# Patient Record
Sex: Female | Born: 1960 | Race: White | Hispanic: No | Marital: Married | Smoking: Never smoker
Health system: Southern US, Community
[De-identification: ages and names within clinical notes are randomized; demographics above are authoritative.]

## PROBLEM LIST (undated history)

## (undated) DIAGNOSIS — R001 Bradycardia, unspecified: Secondary | ICD-10-CM

## (undated) DIAGNOSIS — T7840XA Allergy, unspecified, initial encounter: Secondary | ICD-10-CM

## (undated) HISTORY — DX: Bradycardia, unspecified: R00.1

## (undated) HISTORY — DX: Allergy, unspecified, initial encounter: T78.40XA

## (undated) HISTORY — PX: FOOT SURGERY: SHX648

---

## 1999-07-24 ENCOUNTER — Other Ambulatory Visit: Admission: RE | Admit: 1999-07-24 | Discharge: 1999-07-24 | Payer: Self-pay | Admitting: Obstetrics and Gynecology

## 2002-01-16 ENCOUNTER — Other Ambulatory Visit: Admission: RE | Admit: 2002-01-16 | Discharge: 2002-01-16 | Payer: Self-pay | Admitting: Internal Medicine

## 2004-03-03 ENCOUNTER — Other Ambulatory Visit: Admission: RE | Admit: 2004-03-03 | Discharge: 2004-03-03 | Payer: Self-pay | Admitting: Internal Medicine

## 2006-03-21 ENCOUNTER — Other Ambulatory Visit: Admission: RE | Admit: 2006-03-21 | Discharge: 2006-03-21 | Payer: Self-pay | Admitting: Internal Medicine

## 2009-07-26 ENCOUNTER — Other Ambulatory Visit: Admission: RE | Admit: 2009-07-26 | Discharge: 2009-07-26 | Payer: Self-pay | Admitting: Internal Medicine

## 2011-08-08 ENCOUNTER — Encounter: Payer: Self-pay | Admitting: Gastroenterology

## 2011-08-16 ENCOUNTER — Other Ambulatory Visit: Payer: Self-pay | Admitting: Internal Medicine

## 2011-08-16 DIAGNOSIS — Z1231 Encounter for screening mammogram for malignant neoplasm of breast: Secondary | ICD-10-CM

## 2011-08-27 ENCOUNTER — Ambulatory Visit
Admission: RE | Admit: 2011-08-27 | Discharge: 2011-08-27 | Disposition: A | Discharge status: Home / Self Care | Payer: 59 | Source: Ambulatory Visit | Attending: Internal Medicine | Admitting: Internal Medicine

## 2011-08-27 DIAGNOSIS — Z1231 Encounter for screening mammogram for malignant neoplasm of breast: Secondary | ICD-10-CM

## 2011-09-03 ENCOUNTER — Other Ambulatory Visit: Payer: Self-pay | Admitting: Internal Medicine

## 2011-09-03 DIAGNOSIS — R928 Other abnormal and inconclusive findings on diagnostic imaging of breast: Secondary | ICD-10-CM

## 2011-09-05 ENCOUNTER — Ambulatory Visit
Admission: RE | Admit: 2011-09-05 | Discharge: 2011-09-05 | Disposition: A | Discharge status: Home / Self Care | Payer: 59 | Source: Ambulatory Visit | Attending: Internal Medicine | Admitting: Internal Medicine

## 2011-09-05 DIAGNOSIS — R928 Other abnormal and inconclusive findings on diagnostic imaging of breast: Secondary | ICD-10-CM

## 2011-09-07 ENCOUNTER — Encounter: Payer: Self-pay | Admitting: Gastroenterology

## 2011-09-18 ENCOUNTER — Ambulatory Visit (AMBULATORY_SURGERY_CENTER): Payer: 59 | Admitting: *Deleted

## 2011-09-18 VITALS — Ht 69.5 in | Wt 159.0 lb

## 2011-09-18 DIAGNOSIS — Z1211 Encounter for screening for malignant neoplasm of colon: Secondary | ICD-10-CM

## 2011-09-18 MED ORDER — PEG-KCL-NACL-NASULF-NA ASC-C 100 G PO SOLR: ORAL | Status: DC

## 2011-09-19 ENCOUNTER — Encounter: Payer: Self-pay | Admitting: Internal Medicine

## 2011-10-02 ENCOUNTER — Encounter: Payer: Self-pay | Admitting: Gastroenterology

## 2011-10-03 ENCOUNTER — Ambulatory Visit (AMBULATORY_SURGERY_CENTER): Payer: 59 | Admitting: Internal Medicine

## 2011-10-03 ENCOUNTER — Encounter: Payer: Self-pay | Admitting: Internal Medicine

## 2011-10-03 VITALS — BP 107/72 | HR 57 | Temp 97.9°F | Resp 16 | Ht 69.0 in | Wt 159.0 lb

## 2011-10-03 DIAGNOSIS — Z1211 Encounter for screening for malignant neoplasm of colon: Secondary | ICD-10-CM

## 2011-10-03 MED ORDER — SODIUM CHLORIDE 0.9 % IV SOLN: 500.0000 mL | INTRAVENOUS | Status: AC

## 2011-10-03 NOTE — Patient Instructions (Signed)
YOU HAD AN ENDOSCOPIC PROCEDURE TODAY AT THE [facility]: Refer to the procedure report that was given to you for any specific questions about what was found during the examination.  If the procedure report does not answer your questions, please call your gastroenterologist to clarify.  If you requested that your care partner not be given the details of your procedure findings, then the procedure report has been included in a sealed envelope for you to review at your convenience later.  YOU SHOULD EXPECT: Some feelings of bloating in the abdomen. Passage of more gas than usual.  Walking can help get rid of the air that was put into your GI tract during the procedure and reduce the bloating. If you had a lower endoscopy (such as a colonoscopy or flexible sigmoidoscopy) you may notice spotting of blood in your stool or on the toilet paper. If you underwent a bowel prep for your procedure, then you may not have a normal bowel movement for a few days.  DIET: Your first meal following the procedure should be a light meal and then it is ok to progress to your normal diet.  A half-sandwich or bowl of soup is an example of a good first meal.  Heavy or fried foods are harder to digest and may make you feel nauseous or bloated.  Likewise meals heavy in dairy and vegetables can cause extra gas to form and this can also increase the bloating.  Drink plenty of fluids but you should avoid alcoholic beverages for 24 hours.  ACTIVITY: Your care partner should take you home directly after the procedure.  You should plan to take it easy, moving slowly for the rest of the day.  You can resume normal activity the day after the procedure however you should NOT DRIVE or use heavy machinery for 24 hours (because of the sedation medicines used during the test).    SYMPTOMS TO REPORT IMMEDIATELY: A gastroenterologist can be reached at any hour.  During normal business hours, 8:30 AM to 5:00 PM Monday through Friday,  call [phone].  After hours and on weekends, please call the GI answering service at [phone] who will take a message and have the physician on call contact you.   Following lower endoscopy (colonoscopy or flexible sigmoidoscopy):  Excessive amounts of blood in the stool  Significant tenderness or worsening of abdominal pains  Swelling of the abdomen that is new, acute  Fever of 100F or higher    FOLLOW UP: If any biopsies were taken you will be contacted by phone or by letter within the next 1-3 weeks.  Call your gastroenterologist if you have not heard about the biopsies in 3 weeks.  Our staff will call the home number listed on your records the next business day following your procedure to check on you and address any questions or concerns that you may have at that time regarding the information given to you following your procedure. This is a courtesy call and so if there is no answer at the home number and we have not heard from you through the emergency physician on call, we will assume that you have returned to your regular daily activities without incident.  SIGNATURES/CONFIDENTIALITY: You and/or your care partner have signed paperwork which will be entered into your electronic medical record.  These signatures attest to the fact that that the information above on your After Visit Summary has been reviewed and is understood.  Full responsibility of the confidentiality   of this discharge information lies with you and/or your care-partner.     

## 2011-10-03 NOTE — Progress Notes (Signed)
Patient did not experience any of the following events: a burn prior to discharge; a fall within the facility; wrong site/side/patient/procedure/implant event; or a hospital transfer or hospital admission upon discharge from the facility. (G8907) Patient did not have preoperative order for IV antibiotic SSI prophylaxis. (G8918)  

## 2011-10-03 NOTE — Op Note (Signed)
[facility] [address] [place], [place]  [address]  COLONOSCOPY PROCEDURE REPORT  PATIENT:  Cassandra Fitzgerald, Cassandra Fitzgerald  MR#:  [id number] BIRTHDATE:  Nov 16, 1960, 50 yrs. old  GENDER:  female ENDOSCOPIST:  Hedwig Morton. Juanda Chance, MD REF. BY:  AMANDA COLLIER, P.A. PROCEDURE DATE:  10/03/2011 PROCEDURE:  Colonoscopy 82956 ASA CLASS:  Class I INDICATIONS:  colorectal cancer screening, average risk MEDICATIONS:   MAC sedation, administered by CRNA, propofol (Diprivan) 200 mg  DESCRIPTION OF PROCEDURE:   After the risks and benefits and of the procedure were explained, informed consent was obtained. Digital rectal exam was performed and revealed no rectal masses. The LB CF-[id number] endoscope was introduced through the anus and advanced to the cecum, which was identified by both the appendix and ileocecal valve.  The quality of the prep was excellent, using MoviPrep.  The instrument was then slowly withdrawn as the colon was fully examined. <<PROCEDUREIMAGES>>  FINDINGS:  No polyps or cancers were seen (see image1, image2, image3, and image4).   Retroflexed views in the rectum revealed no abnormalities.    The scope was then withdrawn from the patient and the procedure completed.  COMPLICATIONS:  None ENDOSCOPIC IMPRESSION: 1) No polyps or cancers 2) Normal colonoscopy RECOMMENDATIONS: 1) High fiber diet.  REPEAT EXAM:  In 10 year(s) for.  ______________________________ Hedwig Morton. Juanda Chance, MD  CC:  n. eSIGNED:   Hedwig Morton. Wade Sigala at 10/03/2011 09:36 AM  Cassandra Fitzgerald, [id number]

## 2011-10-04 ENCOUNTER — Telehealth: Payer: Self-pay

## 2011-10-04 NOTE — Telephone Encounter (Signed)
  Follow up Call-  Call back number 10/03/2011  Post procedure Call Back phone  # [phone]  Permission to leave phone message Yes     Patient questions:  Do you have a fever, pain , or abdominal swelling? no Pain Score  0 *  Have you tolerated food without any problems? yes  Have you been able to return to your normal activities? yes  Do you have any questions about your discharge instructions: Diet   no Medications  no Follow up visit  no  Do you have questions or concerns about your Care? no  Actions: * If pain score is 4 or above: No action needed, pain <4.

## 2013-12-30 ENCOUNTER — Other Ambulatory Visit (HOSPITAL_COMMUNITY)
Admission: RE | Admit: 2013-12-30 | Discharge: 2013-12-30 | Disposition: A | Discharge status: Home / Self Care | Payer: BC Managed Care – PPO | Source: Ambulatory Visit | Attending: Family Medicine | Admitting: Family Medicine

## 2013-12-30 ENCOUNTER — Other Ambulatory Visit: Payer: Self-pay | Admitting: Family Medicine

## 2013-12-30 DIAGNOSIS — Z124 Encounter for screening for malignant neoplasm of cervix: Secondary | ICD-10-CM | POA: Insufficient documentation

## 2014-01-01 LAB — CYTOLOGY - PAP

## 2014-08-12 ENCOUNTER — Other Ambulatory Visit: Payer: Self-pay

## 2014-08-12 DIAGNOSIS — Z1231 Encounter for screening mammogram for malignant neoplasm of breast: Secondary | ICD-10-CM

## 2014-08-23 ENCOUNTER — Ambulatory Visit
Admission: RE | Admit: 2014-08-23 | Discharge: 2014-08-23 | Disposition: A | Discharge status: Home / Self Care | Payer: BLUE CROSS/BLUE SHIELD | Source: Ambulatory Visit

## 2014-08-23 DIAGNOSIS — Z1231 Encounter for screening mammogram for malignant neoplasm of breast: Secondary | ICD-10-CM

## 2015-08-26 ENCOUNTER — Other Ambulatory Visit: Payer: Self-pay

## 2015-08-26 DIAGNOSIS — Z1231 Encounter for screening mammogram for malignant neoplasm of breast: Secondary | ICD-10-CM

## 2015-09-14 ENCOUNTER — Ambulatory Visit
Admission: RE | Admit: 2015-09-14 | Discharge: 2015-09-14 | Disposition: A | Discharge status: Home / Self Care | Payer: BLUE CROSS/BLUE SHIELD | Source: Ambulatory Visit

## 2015-09-14 DIAGNOSIS — Z1231 Encounter for screening mammogram for malignant neoplasm of breast: Secondary | ICD-10-CM

## 2016-08-08 ENCOUNTER — Other Ambulatory Visit: Payer: Self-pay | Admitting: Family Medicine

## 2016-08-08 DIAGNOSIS — Z1231 Encounter for screening mammogram for malignant neoplasm of breast: Secondary | ICD-10-CM

## 2016-09-17 ENCOUNTER — Ambulatory Visit
Admission: RE | Admit: 2016-09-17 | Discharge: 2016-09-17 | Disposition: A | Discharge status: Home / Self Care | Payer: PRIVATE HEALTH INSURANCE | Source: Ambulatory Visit | Attending: Family Medicine | Admitting: Family Medicine

## 2016-09-17 DIAGNOSIS — Z1231 Encounter for screening mammogram for malignant neoplasm of breast: Secondary | ICD-10-CM

## 2016-12-31 ENCOUNTER — Ambulatory Visit (INDEPENDENT_AMBULATORY_CARE_PROVIDER_SITE_OTHER): Payer: PRIVATE HEALTH INSURANCE

## 2016-12-31 ENCOUNTER — Encounter: Payer: Self-pay | Admitting: Podiatry

## 2016-12-31 ENCOUNTER — Ambulatory Visit (INDEPENDENT_AMBULATORY_CARE_PROVIDER_SITE_OTHER): Payer: PRIVATE HEALTH INSURANCE | Admitting: Podiatry

## 2016-12-31 VITALS — BP 130/86 | HR 63

## 2016-12-31 DIAGNOSIS — M7731 Calcaneal spur, right foot: Secondary | ICD-10-CM

## 2016-12-31 DIAGNOSIS — R200 Anesthesia of skin: Secondary | ICD-10-CM

## 2016-12-31 MED ORDER — MELOXICAM 15 MG PO TABS: 15.0000 mg | ORAL_TABLET | Freq: Every day | ORAL | 2 refills | Status: AC

## 2016-12-31 NOTE — Progress Notes (Signed)
Subjective:    Patient ID: Kasandra Knudsenerri A Marken, female    DOB: [birth year], 56 y.o.   MRN: [id number]  HPI  Chief Complaint  Patient presents with  . Foot Pain    Rt heel bump & pain onset 2-3 months  . Numbness    Lt ball of foot, has had surgery on this foot.    Ms. Adine MaduraJones Presents the also concerns of pain in the back of her right heel which spent ongoing the last couple months. She states this started off intermittently restricted become more consistent throughout the day. She states it hurts to walk and was a gradual onset. His been worse over 5 weeks. She denies any recent injury or trauma. She points to the back of the right heel which is the majority of symptoms. She denies any recent injury or trauma. No numbness or tingling to the right side. She does state in the ball of the left foot she's had some occasional numbness recently denies any pain. Denies a sharp shooting pain. She said no recent treatment for. No other concerns.    Review of Systems  Musculoskeletal: Positive for arthralgias and gait problem.  All other systems reviewed and are negative.      Objective:   Physical Exam General: AAO x3, NAD  Dermatological: Skin is warm, dry and supple bilateral. Nails x 10 are well manicured; remaining integument appears unremarkable at this time. There are no open sores, no preulcerative lesions, no rash or signs of infection present.  Vascular: Dorsalis Pedis artery and Posterior Tibial artery pedal pulses are 2/4 bilateral with immedate capillary fill time.  There is no pain with calf compression, swelling, warmth, erythema.   Neruologic: Grossly intact via light touch bilateral.  Protective threshold with Semmes Wienstein monofilament intact to all pedal sites bilateral. Negative tinel sign. Distally she has numbness to the tips the toes on the left side. There is normal color to the toes and there is no area of pain.  Musculoskeletal: To the posterior aspect of the right heel  is a prominent retrocalcaneal exostosis off on there is tenderness to palpation. There is localized edema to this area although minimal. There is no erythema or increase in warmth. Equinus is present. Cavus foot type present. No pain lateral compression of calcaneus. No other areas of tenderness. At this time. Muscular strength 5/5 in all groups tested bilateral. There is no area of tenderness identified in the left foot there is no palpable neuroma identified. There is no overlying edema, erythema, increased warmth.  Gait: Unassisted, Nonantalgic.    Assessment & Plan:  56 year old female with right posterior spurring -Treatment options discussed including all alternatives, risks, and complications -Etiology of symptoms were discussed -X-rays were obtained and reviewed with the patient. 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 We'll continue conservative treatment for now. I discussed stretching exercises for the Achilles to help take pressure off the posterior heel. Also discussed shoe modifications. Ice to the area. Also dispensed a gel heel sleeve to help take pressure off the area as well. -Prescribed mobic. Discussed side effects of the medication and directed to stop if any are to occur and call the office.  -The numbness that she describes her left foot is of the toes. This is starting after the right heel pain. This could be result of compensation and pressure to the left foot. We'll continue to monitor.  Ovid CurdMatthew Wagoner,  DPM

## 2016-12-31 NOTE — Patient Instructions (Signed)
Achilles Tendon Rupture, Phase I Rehab  Ask your health care provider which exercises are safe for you. Do exercises exactly as told by your health care provider and adjust them as directed. It is normal to feel mild stretching, pulling, tightness, or discomfort as you do these exercises, but you should stop right away if you feel sudden pain or your pain gets worse. Do not begin these exercises until told by your health care provider.  Stretching and range of motion exercises  These exercises warm up your muscles and joints and improve the movement and flexibility of your lower leg and heel. These exercises also help to relieve pain, numbness, and tingling.  Exercise A: Dorsiflexion and plantar flexion, active range of motion    1. Sit with your left / right knee straight or bent. Do not rest your foot on anything.  2. Flex your left / right ankle to tilt the top of your foot toward your shin. Stop at the first point of resistance or at the angle where your health care provider told you to stop.  3. Hold this position for __________ seconds.  4. Point your toes downward to tilt the top of your foot away from your shin.  5. Hold this position for __________ seconds.  Repeat __________ times. Complete this exercise __________ times a day.  Exercise B: Ankle alphabet    1. Sit with your left / right leg supported at the lower leg.  ? Do not rest your foot on anything.  ? Make sure your foot has room to move freely.  2. Think of your left / right foot as a paintbrush, and move your foot to trace each letter of the alphabet in the air. Keep your hip and knee still while you trace.  3. Trace every letter from A to Z.  Repeat __________ times. Complete this exercise __________ times a day.  Strengthening exercises  These exercises build strength and endurance in your lower leg. Endurance is the ability to use your muscles for a long time, even after they get tired.  Exercise C: Dorsiflexion    1. Secure a rubber exercise  band or tube to an object, such as a table leg, that will stay still when the band is pulled. Secure the other end around your left / right foot.  2. Sit on the floor facing the object with your left / right leg extended and your toes pointing down. The band or tube should be slightly tense when your foot is relaxed.  3. Slowly flex your left / right ankle and toes to bring your foot toward you. Stop when you feel resistance in your Achilles tendon or stop at the angle where your health care provider told you to stop.  4. Hold this position for __________ seconds.  5. Slowly return your foot to the starting position.  Repeat __________ times. Complete this exercise __________ times a day.  Exercise D: Eversion  1. Sit on the floor with your legs straight out in front of you.  2. Loop a rubber exercise band around your left / right foot, across the top part of the walking surface (ball) of that foot. Hold the band in your hands or secure it to a stable object.  3. Slowly push your foot outward, away from your other leg.  4. Hold this position for __________ seconds.  5. Slowly return to the starting position.  Repeat __________ times. Complete this exercise __________ times a day.  Exercise E:   Inversion  1. Sit on the floor with your legs straight out in front of you.  2. Loop a rubber exercise band around your left / right foot, across the top part of the walking surface (ball) of that foot. Hold the band in your hands or secure it to a stable object.  3. Slowly push your foot inward, toward your other leg.  4. Hold this position for __________ seconds.  5. Slowly return your foot to the starting position.  Repeat __________ times. Complete this exercise __________ times a day.  Exercise F: Plantar flexion, seated    1. Sit on a chair with your feet flat on the floor.  2. If told by your health care provider, place __________ lb. on your left / right knee.  3. Keeping your toes firmly on the floor, lift your left /  right heel as far as you can without increasing any discomfort in your ankle.  4. Bring your heel back down to the floor.  Repeat __________ times. Complete this exercise __________ times a day.  This information is not intended to replace advice given to you by your health care provider. Make sure you discuss any questions you have with your health care provider.  Document Released: 06/04/2005 Document Revised: 02/09/2016 Document Reviewed: 04/27/2015  Elsevier Interactive Patient Education © 2018 [employer]

## 2017-02-11 ENCOUNTER — Ambulatory Visit (INDEPENDENT_AMBULATORY_CARE_PROVIDER_SITE_OTHER): Payer: PRIVATE HEALTH INSURANCE | Admitting: Podiatry

## 2017-02-11 ENCOUNTER — Encounter: Payer: Self-pay | Admitting: Podiatry

## 2017-02-11 DIAGNOSIS — M7731 Calcaneal spur, right foot: Secondary | ICD-10-CM | POA: Diagnosis not present

## 2017-02-11 DIAGNOSIS — R200 Anesthesia of skin: Secondary | ICD-10-CM

## 2017-02-13 NOTE — Progress Notes (Signed)
Subjective: 56 year old female presents the office today for follow-up evaluation and numbness to her left foot as well as pain to the best the right heel. She states that she is doing much better right heel. She has been stretching, icing but she is also been taking the meloxicam on a regular basis. Her pain is improved compared to last appointment. She still gets some numbness though left foot. She thinks this is coming from the previous neuroma surgery her numbness is localized to the area of the neuroma surgery. She denies any pain associated with this. Denies any systemic complaints such as fevers, chills, nausea, vomiting. No acute changes since last appointment, and no other complaints at this time.   Objective: AAO x3, NAD DP/PT pulses palpable bilaterally, CRT less than 3 seconds Prominent heel spurs present on the posterior aspect of the right heel. There is minimal tender to palpation on this area. There is no discomfort on the Achilles tendon. Equinus is present. Constant has a negative Achilles tendon appears to be intact. No other areas of tenderness on the right foot. On the left foot there is some numbness tracking along the plantar aspect of the interspace directly underneath were she had the neuroma surgery. There is no area of tenderness there is no palpable neuroma there is no evidence of a reoccurring neuroma this time. No other areas of tenderness on the left foot. No open lesions or pre-ulcerative lesions.  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  -Regards the right heel pain I recommended start to wean off of the anti-inflammatories. Continue stretching and icing daily. Discussed supportive shoes and orthotics and possible heel  lift as well. If symptoms continue will discussed further treatments including possible MRI, EPAT, PT.  -Discussed on the left side ulcerative area but she was to hold off on this impingement monitor area. This is likely result of the neuroma surgery although unusual there is delayed feeling of numbness this area. -Patient encouraged to call the office with any questions, concerns, change in symptoms.   Ovid CurdMatthew Wagoner, DPM

## 2017-04-18 ENCOUNTER — Other Ambulatory Visit: Payer: Self-pay | Admitting: Family Medicine

## 2017-04-18 ENCOUNTER — Other Ambulatory Visit (HOSPITAL_COMMUNITY)
Admission: RE | Admit: 2017-04-18 | Discharge: 2017-04-18 | Disposition: A | Discharge status: Home / Self Care | Payer: PRIVATE HEALTH INSURANCE | Source: Ambulatory Visit | Attending: Family Medicine | Admitting: Family Medicine

## 2017-04-18 DIAGNOSIS — Z01411 Encounter for gynecological examination (general) (routine) with abnormal findings: Secondary | ICD-10-CM | POA: Insufficient documentation

## 2017-04-19 LAB — CYTOLOGY - PAP: Diagnosis: NEGATIVE

## 2017-08-20 ENCOUNTER — Other Ambulatory Visit: Payer: Self-pay | Admitting: Family Medicine

## 2017-08-20 DIAGNOSIS — Z1231 Encounter for screening mammogram for malignant neoplasm of breast: Secondary | ICD-10-CM

## 2017-09-19 ENCOUNTER — Ambulatory Visit
Admission: RE | Admit: 2017-09-19 | Discharge: 2017-09-19 | Disposition: A | Discharge status: Home / Self Care | Payer: PRIVATE HEALTH INSURANCE | Source: Ambulatory Visit | Attending: Family Medicine | Admitting: Family Medicine

## 2017-09-19 DIAGNOSIS — Z1231 Encounter for screening mammogram for malignant neoplasm of breast: Secondary | ICD-10-CM

## 2018-08-18 ENCOUNTER — Other Ambulatory Visit: Payer: Self-pay | Admitting: Family Medicine

## 2018-08-18 DIAGNOSIS — Z1231 Encounter for screening mammogram for malignant neoplasm of breast: Secondary | ICD-10-CM

## 2018-09-22 ENCOUNTER — Ambulatory Visit: Payer: PRIVATE HEALTH INSURANCE

## 2018-11-04 ENCOUNTER — Other Ambulatory Visit: Payer: Self-pay | Admitting: Family Medicine

## 2018-11-04 ENCOUNTER — Other Ambulatory Visit: Payer: Self-pay

## 2018-11-04 ENCOUNTER — Ambulatory Visit
Admission: RE | Admit: 2018-11-04 | Discharge: 2018-11-04 | Disposition: A | Discharge status: Home / Self Care | Payer: PRIVATE HEALTH INSURANCE | Source: Ambulatory Visit | Attending: Family Medicine | Admitting: Family Medicine

## 2018-11-04 DIAGNOSIS — M545 Low back pain, unspecified: Secondary | ICD-10-CM

## 2018-11-17 ENCOUNTER — Ambulatory Visit
Admission: RE | Admit: 2018-11-17 | Discharge: 2018-11-17 | Disposition: A | Discharge status: Home / Self Care | Payer: PRIVATE HEALTH INSURANCE | Source: Ambulatory Visit | Attending: Family Medicine | Admitting: Family Medicine

## 2018-11-17 ENCOUNTER — Other Ambulatory Visit: Payer: Self-pay

## 2018-11-17 DIAGNOSIS — Z1231 Encounter for screening mammogram for malignant neoplasm of breast: Secondary | ICD-10-CM

## 2019-09-03 ENCOUNTER — Other Ambulatory Visit: Payer: Self-pay | Admitting: Family Medicine

## 2019-09-03 DIAGNOSIS — M8580 Other specified disorders of bone density and structure, unspecified site: Secondary | ICD-10-CM

## 2019-09-17 ENCOUNTER — Other Ambulatory Visit: Payer: Self-pay | Admitting: Family Medicine

## 2019-09-17 DIAGNOSIS — Z1231 Encounter for screening mammogram for malignant neoplasm of breast: Secondary | ICD-10-CM

## 2019-12-07 ENCOUNTER — Ambulatory Visit
Admission: RE | Admit: 2019-12-07 | Discharge: 2019-12-07 | Disposition: A | Discharge status: Home / Self Care | Payer: PRIVATE HEALTH INSURANCE | Source: Ambulatory Visit | Attending: Family Medicine | Admitting: Family Medicine

## 2019-12-07 ENCOUNTER — Other Ambulatory Visit: Payer: Self-pay

## 2019-12-07 DIAGNOSIS — M8580 Other specified disorders of bone density and structure, unspecified site: Secondary | ICD-10-CM

## 2019-12-07 DIAGNOSIS — Z1231 Encounter for screening mammogram for malignant neoplasm of breast: Secondary | ICD-10-CM

## 2020-11-03 ENCOUNTER — Other Ambulatory Visit: Payer: Self-pay | Admitting: Family Medicine

## 2020-11-03 DIAGNOSIS — Z1231 Encounter for screening mammogram for malignant neoplasm of breast: Secondary | ICD-10-CM

## 2020-12-15 ENCOUNTER — Other Ambulatory Visit (HOSPITAL_COMMUNITY)
Admission: RE | Admit: 2020-12-15 | Discharge: 2020-12-15 | Disposition: A | Discharge status: Home / Self Care | Payer: No Typology Code available for payment source | Source: Ambulatory Visit | Attending: Family Medicine | Admitting: Family Medicine

## 2020-12-15 ENCOUNTER — Other Ambulatory Visit: Payer: Self-pay | Admitting: Family Medicine

## 2020-12-15 DIAGNOSIS — Z124 Encounter for screening for malignant neoplasm of cervix: Secondary | ICD-10-CM | POA: Diagnosis present

## 2020-12-16 ENCOUNTER — Other Ambulatory Visit (HOSPITAL_COMMUNITY): Payer: Self-pay | Admitting: Family Medicine

## 2020-12-21 LAB — CYTOLOGY - PAP
Comment: NEGATIVE
Diagnosis: NEGATIVE
High risk HPV: NEGATIVE

## 2020-12-22 ENCOUNTER — Other Ambulatory Visit: Payer: Self-pay

## 2020-12-22 ENCOUNTER — Ambulatory Visit (HOSPITAL_COMMUNITY)
Admission: RE | Admit: 2020-12-22 | Discharge: 2020-12-22 | Disposition: A | Discharge status: Home / Self Care | Payer: Self-pay | Source: Ambulatory Visit | Attending: Family Medicine | Admitting: Family Medicine

## 2020-12-22 DIAGNOSIS — R918 Other nonspecific abnormal finding of lung field: Secondary | ICD-10-CM | POA: Insufficient documentation

## 2020-12-29 ENCOUNTER — Ambulatory Visit
Admission: RE | Admit: 2020-12-29 | Discharge: 2020-12-29 | Disposition: A | Discharge status: Home / Self Care | Payer: PRIVATE HEALTH INSURANCE | Source: Ambulatory Visit | Attending: Family Medicine | Admitting: Family Medicine

## 2020-12-29 ENCOUNTER — Other Ambulatory Visit: Payer: Self-pay

## 2020-12-29 DIAGNOSIS — Z1231 Encounter for screening mammogram for malignant neoplasm of breast: Secondary | ICD-10-CM

## 2021-01-02 ENCOUNTER — Other Ambulatory Visit (HOSPITAL_COMMUNITY): Payer: Self-pay | Admitting: Family Medicine

## 2021-12-13 ENCOUNTER — Encounter: Payer: Self-pay | Admitting: Gastroenterology

## 2021-12-14 ENCOUNTER — Other Ambulatory Visit: Payer: Self-pay | Admitting: Family Medicine

## 2021-12-14 DIAGNOSIS — Z1231 Encounter for screening mammogram for malignant neoplasm of breast: Secondary | ICD-10-CM

## 2021-12-15 ENCOUNTER — Other Ambulatory Visit: Payer: Self-pay | Admitting: Family Medicine

## 2021-12-15 DIAGNOSIS — E2839 Other primary ovarian failure: Secondary | ICD-10-CM

## 2022-01-05 ENCOUNTER — Ambulatory Visit
Admission: RE | Admit: 2022-01-05 | Discharge: 2022-01-05 | Disposition: A | Discharge status: Home / Self Care | Payer: No Typology Code available for payment source | Source: Ambulatory Visit | Attending: Family Medicine | Admitting: Family Medicine

## 2022-01-05 DIAGNOSIS — Z1231 Encounter for screening mammogram for malignant neoplasm of breast: Secondary | ICD-10-CM

## 2022-02-13 ENCOUNTER — Ambulatory Visit
Admission: RE | Admit: 2022-02-13 | Discharge: 2022-02-13 | Disposition: A | Discharge status: Home / Self Care | Payer: No Typology Code available for payment source | Source: Ambulatory Visit | Attending: Family Medicine | Admitting: Family Medicine

## 2022-02-13 DIAGNOSIS — E2839 Other primary ovarian failure: Secondary | ICD-10-CM

## 2022-12-11 ENCOUNTER — Other Ambulatory Visit: Payer: Self-pay | Admitting: Family Medicine

## 2022-12-11 DIAGNOSIS — Z0000 Encounter for general adult medical examination without abnormal findings: Secondary | ICD-10-CM

## 2023-01-07 ENCOUNTER — Ambulatory Visit
Admission: RE | Admit: 2023-01-07 | Discharge: 2023-01-07 | Disposition: A | Discharge status: Home / Self Care | Payer: No Typology Code available for payment source | Source: Ambulatory Visit | Attending: Family Medicine | Admitting: Family Medicine

## 2023-01-07 DIAGNOSIS — Z0000 Encounter for general adult medical examination without abnormal findings: Secondary | ICD-10-CM

## 2023-02-13 IMAGING — MG MM DIGITAL SCREENING BILAT W/ TOMO AND CAD
8 series · 9 of 24 positions shown · non-contrast
Comparison: Previous exam(s).

CLINICAL DATA: Screening.

EXAM:
DIGITAL SCREENING BILATERAL MAMMOGRAM WITH TOMOSYNTHESIS AND CAD
TECHNIQUE: Bilateral screening digital craniocaudal and mediolateral oblique
mammograms were obtained. Bilateral screening digital breast
tomosynthesis was performed. The images were evaluated with
computer-aided detection.

[L CC synth-2D]
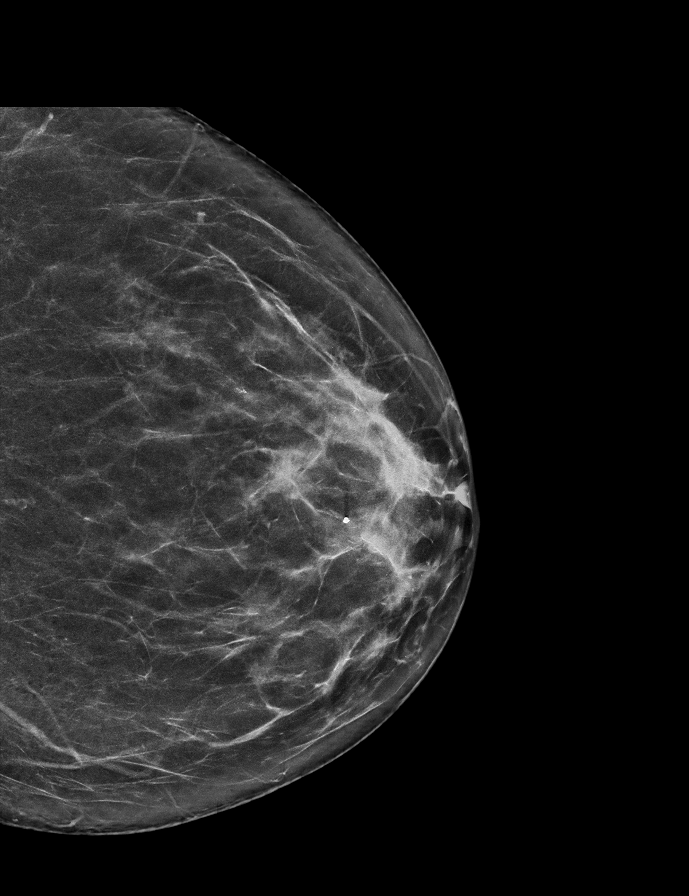

[R CC synth-2D]
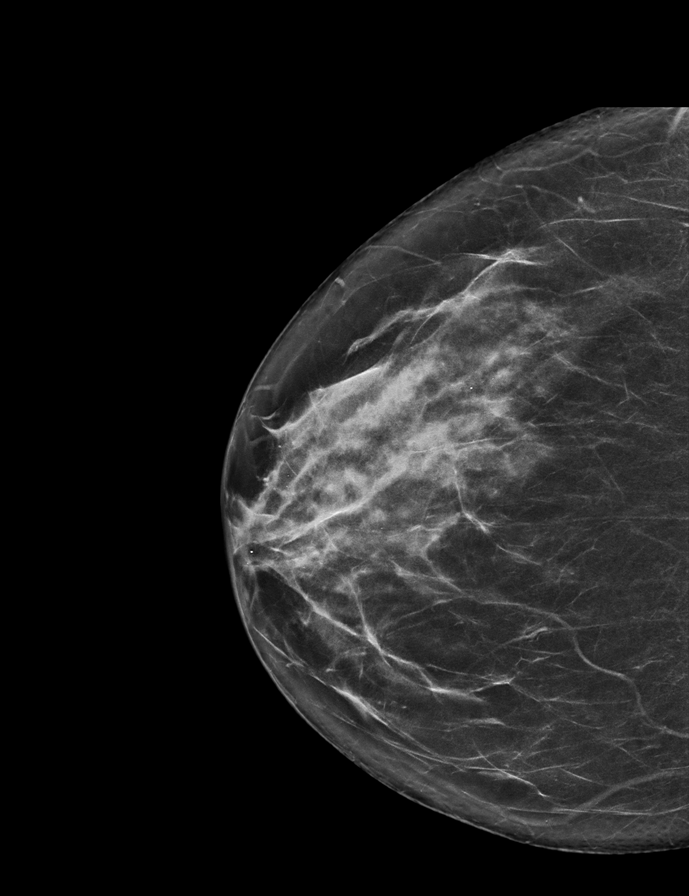

[R MLO synth-2D]
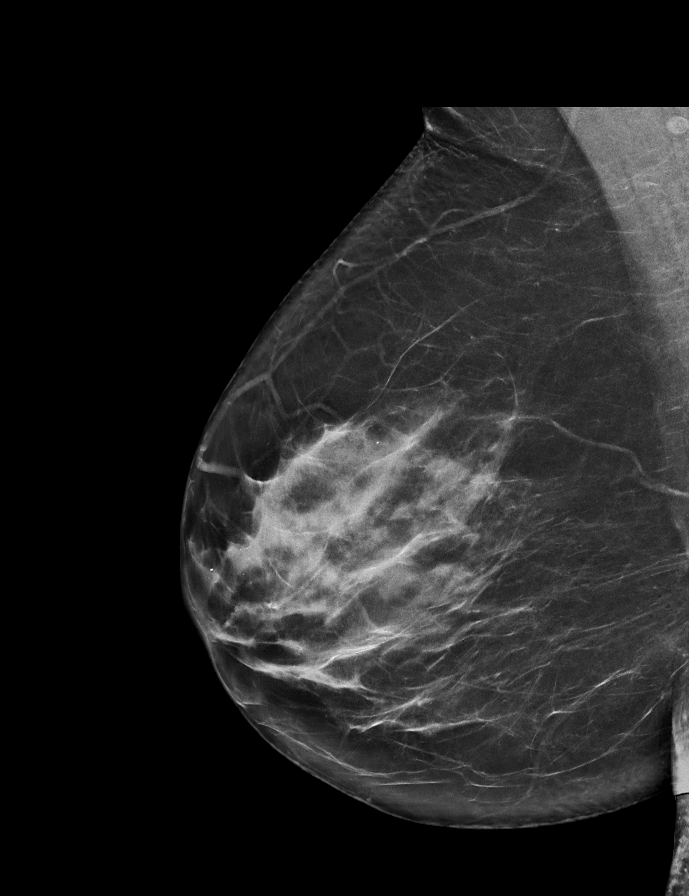

[L MLO synth-2D]
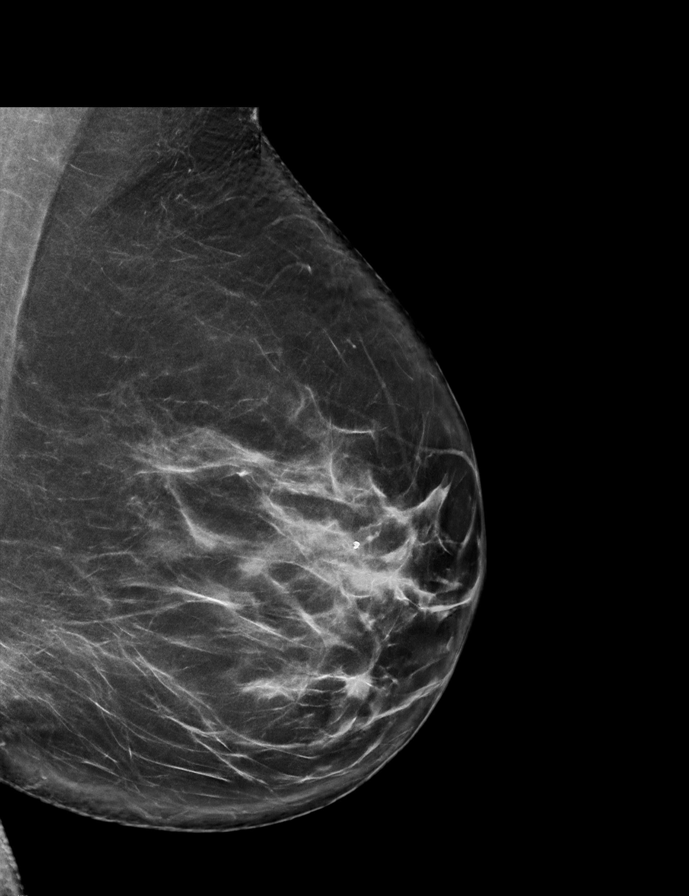

[L CC tomo · 2 of 67 frames shown]
[frame 22/67]
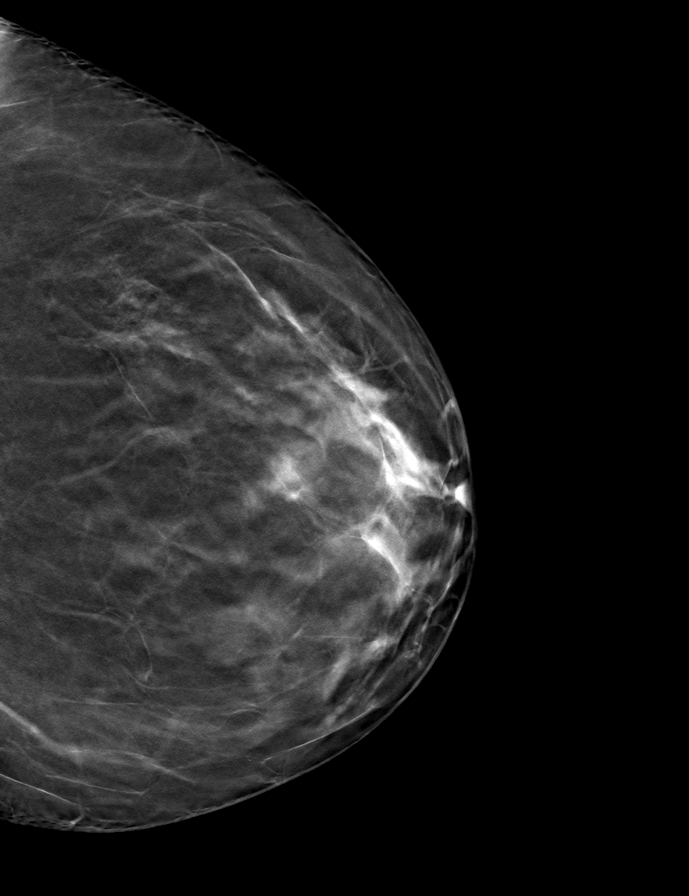
[frame 34/67]
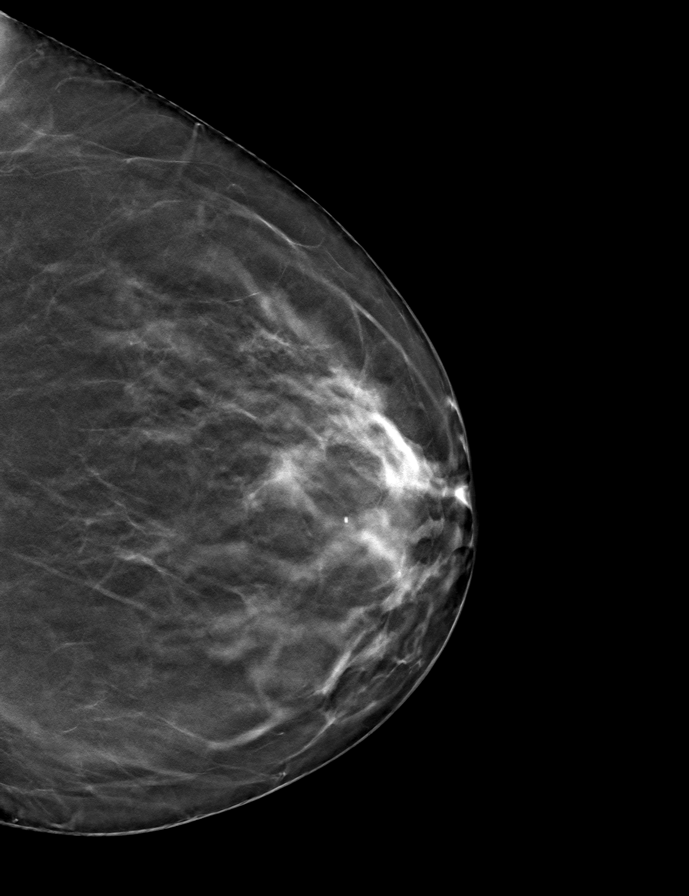

[R CC tomo · tomo slice 34/67.0]
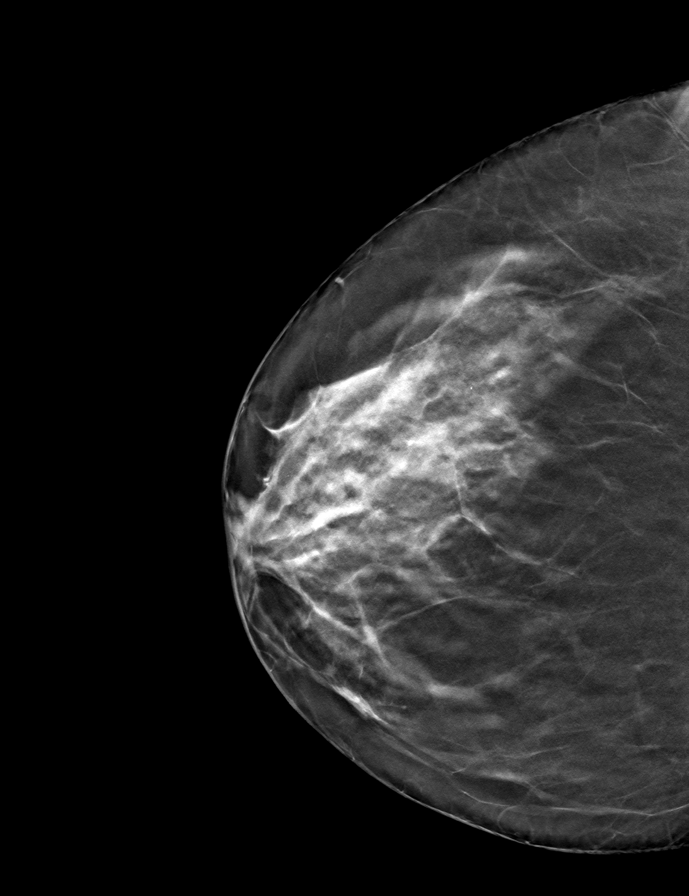

[R MLO tomo · tomo slice 39/77.0]
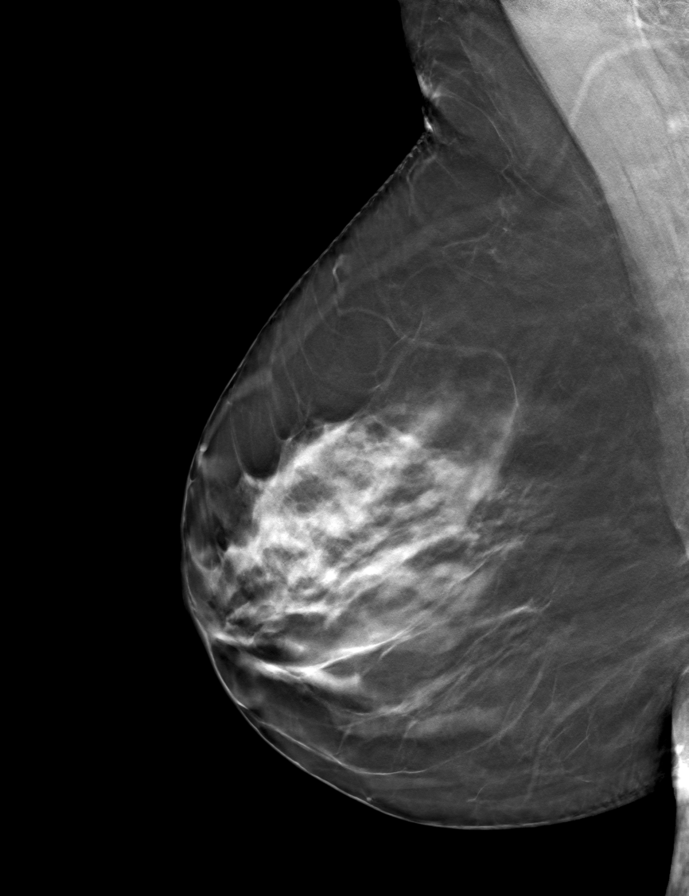

[L MLO tomo · tomo slice 41/80.0]
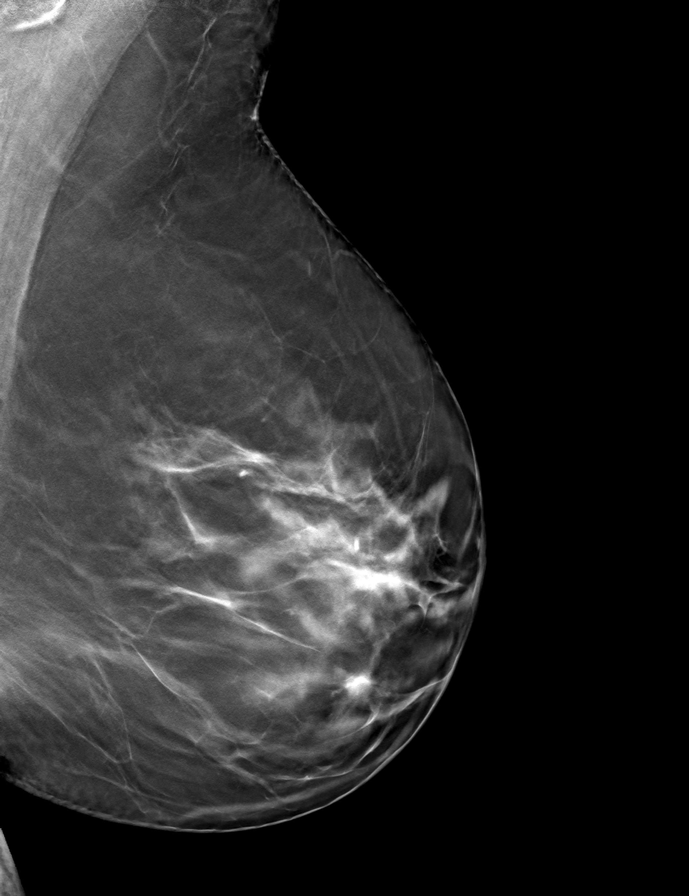

[9 of 24 positions shown; findings below may reference images not displayed]

ACR Breast Density Category c: The breast tissue is heterogeneously
dense, which may obscure small masses.
FINDINGS: There are no findings suspicious for malignancy.
IMPRESSION: No mammographic evidence of malignancy. A result letter of this
screening mammogram will be mailed directly to the patient.

RECOMMENDATION:
Screening mammogram in one year. (Code:Q3-W-BC3)

BI-RADS CATEGORY  1: Negative.

## 2023-12-06 ENCOUNTER — Other Ambulatory Visit: Payer: Self-pay | Admitting: Family Medicine

## 2023-12-06 DIAGNOSIS — Z1231 Encounter for screening mammogram for malignant neoplasm of breast: Secondary | ICD-10-CM

## 2024-01-08 ENCOUNTER — Ambulatory Visit
Admission: RE | Admit: 2024-01-08 | Discharge: 2024-01-08 | Disposition: A | Discharge status: Home / Self Care | Source: Ambulatory Visit | Attending: Family Medicine | Admitting: Family Medicine

## 2024-01-08 DIAGNOSIS — Z1231 Encounter for screening mammogram for malignant neoplasm of breast: Secondary | ICD-10-CM

## 2024-02-10 ENCOUNTER — Other Ambulatory Visit (HOSPITAL_COMMUNITY): Payer: Self-pay | Admitting: Family Medicine

## 2024-02-10 DIAGNOSIS — M8589 Other specified disorders of bone density and structure, multiple sites: Secondary | ICD-10-CM

## 2024-02-18 ENCOUNTER — Ambulatory Visit (HOSPITAL_COMMUNITY)
Admission: RE | Admit: 2024-02-18 | Discharge: 2024-02-18 | Disposition: A | Discharge status: Home / Self Care | Source: Ambulatory Visit | Attending: Family Medicine | Admitting: Family Medicine

## 2024-02-18 DIAGNOSIS — M8589 Other specified disorders of bone density and structure, multiple sites: Secondary | ICD-10-CM | POA: Insufficient documentation
